# Patient Record
Sex: Male | Born: 1990 | Race: White | Hispanic: No | Marital: Single | Smoking: Current every day smoker
Health system: Southern US, Community
[De-identification: ages and names within clinical notes are randomized; demographics above are authoritative.]

## PROBLEM LIST (undated history)

## (undated) DIAGNOSIS — J45909 Unspecified asthma, uncomplicated: Secondary | ICD-10-CM

## (undated) HISTORY — PX: TONSILLECTOMY: SUR1361

---
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
## 2010-07-22 ENCOUNTER — Emergency Department: Payer: Self-pay | Admitting: Emergency Medicine

## 2011-01-18 ENCOUNTER — Emergency Department: Payer: Self-pay | Admitting: Emergency Medicine

## 2011-07-01 ENCOUNTER — Emergency Department: Payer: Self-pay | Admitting: Unknown Physician Specialty

## 2012-10-23 ENCOUNTER — Emergency Department: Payer: Self-pay | Admitting: Emergency Medicine

## 2013-07-24 ENCOUNTER — Emergency Department: Payer: Self-pay | Admitting: Emergency Medicine

## 2013-10-01 ENCOUNTER — Emergency Department: Payer: Self-pay | Admitting: Emergency Medicine

## 2013-12-09 ENCOUNTER — Emergency Department: Payer: Self-pay | Admitting: Emergency Medicine

## 2013-12-11 LAB — GC/CHLAMYDIA PROBE AMP

## 2015-01-02 ENCOUNTER — Ambulatory Visit
Admission: EM | Admit: 2015-01-02 | Discharge: 2015-01-02 | Disposition: A | Discharge status: Home / Self Care | Payer: Self-pay | Attending: Family Medicine | Admitting: Family Medicine

## 2015-01-02 DIAGNOSIS — S39012A Strain of muscle, fascia and tendon of lower back, initial encounter: Secondary | ICD-10-CM

## 2015-01-02 DIAGNOSIS — K029 Dental caries, unspecified: Secondary | ICD-10-CM

## 2015-01-02 HISTORY — DX: Unspecified asthma, uncomplicated: J45.909

## 2015-01-02 MED ORDER — METAXALONE 800 MG PO TABS: 800.0000 mg | ORAL_TABLET | Freq: Three times a day (TID) | ORAL | Status: AC

## 2015-01-02 MED ORDER — NAPROXEN 500 MG PO TABS: 500.0000 mg | ORAL_TABLET | Freq: Two times a day (BID) | ORAL | Status: AC

## 2015-01-02 MED ORDER — KETOROLAC TROMETHAMINE 60 MG/2ML IM SOLN
60.0000 mg | Freq: Once | INTRAMUSCULAR | Status: AC
  Administered 2015-01-02: 60 mg via INTRAMUSCULAR

## 2015-01-02 NOTE — ED Notes (Signed)
States when woke up this morning,states low back was very painful and couldn't work today. Also states top right molar is infected and has appointment next month to have tooth pulled. Stated tooth very painful

## 2015-01-02 NOTE — Discharge Instructions (Signed)
Back Injury Prevention °Back injuries can be very painful. They can also be difficult to heal. After having one back injury, you are more likely to injure your back again. It is important to learn how to avoid injuring or re-injuring your back. The following tips can help you to prevent a back injury. °WHAT SHOULD I KNOW ABOUT PHYSICAL FITNESS? °· Exercise for 30 minutes per day on most days of the week or as directed by your health care provider. Make sure to: °· Do aerobic exercises, such as walking, jogging, biking, or swimming. °· Do exercises that increase balance and strength, such as tai chi and yoga. These can decrease your risk of falling and injuring your back. °· Do stretching exercises to help with flexibility. °· Try to develop strong abdominal muscles. Your abdominal muscles provide a lot of the support that is needed by your back. °· Maintain a healthy weight.  This helps to decrease your risk of a back injury. °WHAT SHOULD I KNOW ABOUT MY DIET? °· Talk with your health care provider about your overall diet. Take supplements and vitamins only as directed by your health care provider. °· Talk with your health care provider about how much calcium and vitamin D you need each day. These nutrients help to prevent weakening of the bones (osteoporosis). Osteoporosis can cause broken (fractured) bones, which lead to back pain. °· Include good sources of calcium in your diet, such as dairy products, green leafy vegetables, and products that have had calcium added to them (fortified). °· Include good sources of vitamin D in your diet, such as milk and foods that are fortified with vitamin D. °WHAT SHOULD I KNOW ABOUT MY POSTURE? °· Sit up straight and stand up straight. Avoid leaning forward when you sit or hunching over when you stand. °· Choose chairs that have good low-back (lumbar) support. °· If you work at a desk, sit close to it so you do not need to lean over. Keep your chin tucked in. Keep your neck  drawn back, and keep your elbows bent at a right angle. Your arms should look like the letter "L." °· Sit high and close to the steering wheel when you drive. Add a lumbar support to your car seat, if needed. °· Avoid sitting or standing in one position for very long. Take breaks to get up, stretch, and walk around at least one time every hour. Take breaks every hour if you are driving for long periods of time. °· Sleep on your side with your knees slightly bent, or sleep on your back with a pillow under your knees. Do not lie on the front of your body to sleep. °WHAT SHOULD I KNOW ABOUT LIFTING, TWISTING, AND REACHING? °Lifting and Heavy Lifting °· Avoid heavy lifting, especially repetitive heavy lifting. If you must do heavy lifting: °· Stretch before lifting. °· Work slowly. °· Rest between lifts. °· Use a tool such as a cart or a dolly to move objects if one is available. °· Make several small trips instead of carrying one heavy load. °· Ask for help when you need it, especially when moving big objects. °· Follow these steps when lifting: °· Stand with your feet shoulder-width apart. °· Get as close to the object as you can. Do not try to pick up a heavy object that is far from your body. °· Use handles or lifting straps if they are available. °· Bend at your knees. Squat down, but keep your heels off the floor. °·   Keep your shoulders pulled back, your chin tucked in, and your back straight.  Lift the object slowly while you tighten the muscles in your legs, abdomen, and buttocks. Keep the object as close to the center of your body as possible.  Follow these steps when putting down a heavy load:  Stand with your feet shoulder-width apart.  Lower the object slowly while you tighten the muscles in your legs, abdomen, and buttocks. Keep the object as close to the center of your body as possible.  Keep your shoulders pulled back, your chin tucked in, and your back straight.  Bend at your knees. Squat  down, but keep your heels off the floor.  Use handles or lifting straps if they are available. Twisting and Reaching  Avoid lifting heavy objects above your waist.  Do not twist at your waist while you are lifting or carrying a load. If you need to turn, move your feet.  Do not bend over without bending at your knees.  Avoid reaching over your head, across a table, or for an object on a high surface. WHAT ARE SOME OTHER TIPS?  Avoid wet floors and icy ground. Keep sidewalks clear of ice to prevent falls.  Do not sleep on a mattress that is too soft or too hard.  Keep items that are used frequently within easy reach.  Put heavier objects on shelves at waist level, and put lighter objects on lower or higher shelves.  Find ways to decrease your stress, such as exercise, massage, or relaxation techniques. Stress can build up in your muscles. Tense muscles are more vulnerable to injury.  Talk with your health care provider if you feel anxious or depressed. These conditions can make back pain worse.  Wear flat heel shoes with cushioned soles.  Avoid sudden movements.  Use both shoulder straps when carrying a backpack.  Do not use any tobacco products, including cigarettes, chewing tobacco, or electronic cigarettes. If you need help quitting, ask your health care provider.   This information is not intended to replace advice given to you by your health care provider. Make sure you discuss any questions you have with your health care provider.   Document Released: 02/14/2004 Document Revised: 05/23/2014 Document Reviewed: 01/10/2014 Elsevier Interactive Patient Education 2016 [place] [employer] Dental caries (also called tooth decay) is the most common oral disease. It can occur at any age but is more common in children and young adults.  HOW DENTAL CARIES DEVELOPS  The process of decay begins when bacteria and foods (particularly sugars and starches) combine in your  mouth to produce plaque. Plaque is a substance that sticks to the hard, outer surface of a tooth (enamel). The bacteria in plaque produce acids that attack enamel. These acids may also attack the root surface of a tooth (cementum) if it is exposed. Repeated attacks dissolve these surfaces and create holes in the tooth (cavities). If left untreated, the acids destroy the other layers of the tooth.  RISK FACTORS  Frequent sipping of sugary beverages.   Frequent snacking on sugary and starchy foods, especially those that easily get stuck in the teeth.   Poor oral hygiene.   Dry mouth.   Substance abuse such as methamphetamine abuse.   Broken or poor-fitting dental restorations.   Eating disorders.   Gastroesophageal reflux disease (GERD).   Certain radiation treatments to the head and neck. SYMPTOMS In the early stages of dental caries, symptoms are seldom present. Sometimes white, chalky areas  may be seen on the enamel or other tooth layers. In later stages, symptoms may include:  Pits and holes on the enamel.  Toothache after sweet, hot, or cold foods or drinks are consumed.  Pain around the tooth.  Swelling around the tooth. DIAGNOSIS  Most of the time, dental caries is detected during a regular dental checkup. A diagnosis is made after a thorough medical and dental history is taken and the surfaces of your teeth are checked for signs of dental caries. Sometimes special instruments, such as lasers, are used to check for dental caries. Dental X-ray exams may be taken so that areas not visible to the eye (such as between the contact areas of the teeth) can be checked for cavities.  TREATMENT  If dental caries is in its early stages, it may be reversed with a fluoride treatment or an application of a remineralizing agent at the dental office. Thorough brushing and flossing at home is needed to aid these treatments. If it is in its later stages, treatment depends on the location  and extent of tooth destruction:   If a small area of the tooth has been destroyed, the destroyed area will be removed and cavities will be filled with a material such as gold, silver amalgam, or composite resin.   If a large area of the tooth has been destroyed, the destroyed area will be removed and a cap (crown) will be fitted over the remaining tooth structure.   If the center part of the tooth (pulp) is affected, a procedure called a root canal will be needed before a filling or crown can be placed.   If most of the tooth has been destroyed, the tooth may need to be pulled (extracted). HOME CARE INSTRUCTIONS You can prevent, stop, or reverse dental caries at home by practicing good oral hygiene. Good oral hygiene includes:  Thoroughly cleaning your teeth at least twice a day with a toothbrush and dental floss.   Using a fluoride toothpaste. A fluoride mouth rinse may also be used if recommended by your dentist or health care provider.   Restricting the amount of sugary and starchy foods and sugary liquids you consume.   Avoiding frequent snacking on these foods and sipping of these liquids.   Keeping regular visits with a dentist for checkups and cleanings. PREVENTION   Practice good oral hygiene.  Consider a dental sealant. A dental sealant is a coating material that is applied by your dentist to the pits and grooves of teeth. The sealant prevents food from being trapped in them. It may protect the teeth for several years.  Ask about fluoride supplements if you live in a community without fluorinated water or with water that has a low fluoride content. Use fluoride supplements as directed by your dentist or health care provider.  Allow fluoride varnish applications to teeth if directed by your dentist or health care provider.   This information is not intended to replace advice given to you by your health care provider. Make sure you discuss any questions you have with  your health care provider.   Document Released: 09/28/2001 Document Revised: 01/27/2014 Document Reviewed: 01/09/2012 Elsevier Interactive Patient Education 2016 [employer]  Lumbosacral Strain Lumbosacral strain is a strain of any of the parts that make up your lumbosacral vertebrae. Your lumbosacral vertebrae are the bones that make up the lower third of your backbone. Your lumbosacral vertebrae are held together by muscles and tough, fibrous tissue (ligaments).  CAUSES  A  sudden blow to your back can cause lumbosacral strain. Also, anything that causes an excessive stretch of the muscles in the low back can cause this strain. This is typically seen when people exert themselves strenuously, fall, lift heavy objects, bend, or crouch repeatedly. RISK FACTORS  Physically demanding work.  Participation in pushing or pulling sports or sports that require a sudden twist of the back (tennis, golf, baseball).  Weight lifting.  Excessive lower back curvature.  Forward-tilted pelvis.  Weak back or abdominal muscles or both.  Tight hamstrings. SIGNS AND SYMPTOMS  Lumbosacral strain may cause pain in the area of your injury or pain that moves (radiates) down your leg.  DIAGNOSIS Your health care provider can often diagnose lumbosacral strain through a physical exam. In some cases, you may need tests such as X-ray exams.  TREATMENT  Treatment for your lower back injury depends on many factors that your clinician will have to evaluate. However, most treatment will include the use of anti-inflammatory medicines. HOME CARE INSTRUCTIONS   Avoid hard physical activities (tennis, racquetball, waterskiing) if you are not in proper physical condition for it. This may aggravate or create problems.  If you have a back problem, avoid sports requiring sudden body movements. Swimming and walking are generally safer activities.  Maintain good posture.  Maintain a healthy weight.  For acute  conditions, you may put ice on the injured area.  Put ice in a plastic bag.  Place a towel between your skin and the bag.  Leave the ice on for 20 minutes, 2-3 times a day.  When the low back starts healing, stretching and strengthening exercises may be recommended. SEEK MEDICAL CARE IF:  Your back pain is getting worse.  You experience severe back pain not relieved with medicines. SEEK IMMEDIATE MEDICAL CARE IF:   You have numbness, tingling, weakness, or problems with the use of your arms or legs.  There is a change in bowel or bladder control.  You have increasing pain in any area of the body, including your belly (abdomen).  You notice shortness of breath, dizziness, or feel faint.  You feel sick to your stomach (nauseous), are throwing up (vomiting), or become sweaty.  You notice discoloration of your toes or legs, or your feet get very cold. MAKE SURE YOU:   Understand these instructions.  Will watch your condition.  Will get help right away if you are not doing well or get worse.   This information is not intended to replace advice given to you by your health care provider. Make sure you discuss any questions you have with your health care provider.   Document Released: 10/16/2004 Document Revised: 01/27/2014 Document Reviewed: 08/25/2012 Elsevier Interactive Patient Education [employer].

## 2015-01-02 NOTE — ED Provider Notes (Signed)
CSN: [id number]     Arrival date & time 01/02/15  1654 History   First MD Initiated Contact with Patient 01/02/15 1804     Chief Complaint  Patient presents with  . Back Pain  . Dental Pain   (Consider location/radiation/quality/duration/timing/severity/associated sxs/prior Treatment) HPI  24 year old male who presents with 2 separate problems.  He states he woke up this morning with low back pain and was very painful from work today. 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 They weigh between 125 and 150 pounds each. He does remember a specific injury but this morning he certainly had back pain indicates the upper lumbar level with radiation up his spine and even him feeling numbness and tingling into his arms. Walking also is very painful for him especially when he walks on his left leg. No incontinence. The spasms feels on the left side.  His second problem is that of headache that has been chronic. Is a large cavity which has gone into the pulp. Especially painful when he has ever passed over it. Lastly is very painful. He does have an appointment with the [facility] dental school on 02/01/2015. He's been taking Tylenol but that has not been effective in helping with the pain. He said no fever or chills.  Past Medical History  Diagnosis Date  . Asthma    Past Surgical History  Procedure Laterality Date  . Tonsillectomy     Family History  Problem Relation Age of Onset  . Diabetes Mother    Social History  Substance Use Topics  . Smoking status: Current Every Day Smoker -- 0.50 packs/day    Types: Cigarettes  . Smokeless tobacco: None  . Alcohol Use: No    Review of Systems  Constitutional: Positive for activity change. Negative for fever, chills, appetite change and fatigue.  HENT: Positive for dental problem.   Musculoskeletal: Positive for myalgias and back pain.  All  other systems reviewed and are negative.   Allergies  Penicillins  Home Medications   Prior to Admission medications   Medication Sig Start Date End Date Taking? Authorizing Provider  acetaminophen (TYLENOL) 500 MG tablet Take 500 mg by mouth every 6 (six) hours as needed.   Yes Historical Provider, MD  metaxalone (SKELAXIN) 800 MG tablet Take 1 tablet (800 mg total) by mouth 3 (three) times daily. 01/02/15   Lutricia Feil, PA-C  naproxen (NAPROSYN) 500 MG tablet Take 1 tablet (500 mg total) by mouth 2 (two) times daily with a meal. 01/02/15   Lutricia Feil, PA-C   Meds Ordered and Administered this Visit   Medications  ketorolac (TORADOL) injection 60 mg (60 mg Intramuscular Given 01/02/15 1837)    BP 103/66 mmHg  Pulse 77  Temp(Src) 97.5 F (36.4 C) (Tympanic)  Resp 16  Ht  (1.753 m)  Wt 140 lb (63.504 kg)  BMI 20.67 kg/m2  SpO2 100% No data found.   Physical Exam  Constitutional: He is oriented to person, place, and time. He appears well-developed and well-nourished. No distress.  HENT:  Head: Normocephalic and atraumatic.  Examination of his teeth shows a large dental caries extending into the pulp on the first molar upper right. There is no gum swelling present. Patient has no facial swelling. No evidence of an abscess is present today. He is afebrile  Eyes: Conjunctivae are normal. Pupils are equal, round, and  reactive to light.  Neck: Normal range of motion. Neck supple.  Musculoskeletal: He exhibits tenderness.  Examination of the lumbar spine obvious spasm of the upper segments of. Range of motion is good with for flexion is slightly limited with lateral flexion bilaterally with complaints of pain area of spasm. Rotation is perhaps the most uncomfortable to the left and right. Is able to toe and heel walk adequately. Deep tendon reflexes are bilaterally symmetrical. Degrees testing is negative in the sitting position bilaterally at 90.  Neurological: He  is alert and oriented to person, place, and time. He displays normal reflexes. He exhibits normal muscle tone.  Skin: Skin is warm and dry. He is not diaphoretic.  Psychiatric: He has a normal mood and affect. His behavior is normal. Judgment and thought content normal.  Nursing note and vitals reviewed.   ED Course  Procedures (including critical care time)  Labs Review Labs Reviewed - No data to display  Imaging Review No results found.   Visual Acuity Review  Right Eye Distance:   Left Eye Distance:   Bilateral Distance:    Right Eye Near:   Left Eye Near:    Bilateral Near:      18:37 Medication Given JE  ketorolac (TORADOL) injection 60 mg - Dose: 60 mg ; Route: Intramuscular ; Site: Other ; Scheduled Time: 1830 ; Comment: Exp: 03/18 Lot: [id number] Left glut        MDM   1. Dental caries extending into pulp   2. Lumbar strain, initial encounter    Discharge Medication List as of 01/02/2015  6:33 PM    START taking these medications   Details  metaxalone (SKELAXIN) 800 MG tablet Take 1 tablet (800 mg total) by mouth 3 (three) times daily., Starting 01/02/2015, Until Discontinued, Print    naproxen (NAPROSYN) 500 MG tablet Take 1 tablet (500 mg total) by mouth 2 (two) times daily with a meal., Starting 01/02/2015, Until Discontinued, Print      Plan: 1. Diagnosis reviewed with patient 2. rx as per orders; risks, benefits, potential side effects reviewed with patient 3. Recommend supportive treatment with rest and symptom avoidance. He needs to call the dental school to see if he get a sooner if at all possible. In meantime I have given him prescription for Naprosyn that he should take on a daily basis until he seen by the dentist. So to help with his back spasm. He states that he must work up because of a point system that they have did give him a note stating he was here today. Follow-up with a primary care physician if he is having any additional problems 4. F/u  prn if symptoms worsen or don't improve     Lutricia FeilWilliam P Roemer, PA-C 01/02/15 1858

## 2015-10-30 ENCOUNTER — Encounter: Payer: Self-pay | Admitting: *Deleted

## 2015-10-30 ENCOUNTER — Ambulatory Visit
Admission: EM | Admit: 2015-10-30 | Discharge: 2015-10-30 | Disposition: A | Discharge status: Home / Self Care | Payer: Self-pay | Attending: Family Medicine | Admitting: Family Medicine

## 2015-10-30 DIAGNOSIS — K529 Noninfective gastroenteritis and colitis, unspecified: Secondary | ICD-10-CM

## 2015-10-30 MED ORDER — BISMUTH SUBSALICYLATE 262 MG/15ML PO SUSP: 30.0000 mL | Freq: Three times a day (TID) | ORAL | 0 refills | Status: AC

## 2015-10-30 MED ORDER — RANITIDINE HCL 150 MG PO CAPS: ORAL_CAPSULE | ORAL | 0 refills | Status: AC

## 2015-10-30 MED ORDER — ONDANSETRON 8 MG PO TBDP
8.0000 mg | ORAL_TABLET | Freq: Once | ORAL | Status: AC
  Administered 2015-10-30: 8 mg via ORAL

## 2015-10-30 MED ORDER — ONDANSETRON 8 MG PO TBDP: 8.0000 mg | ORAL_TABLET | Freq: Three times a day (TID) | ORAL | 0 refills | Status: AC | PRN

## 2015-10-30 NOTE — ED Triage Notes (Signed)
Pt awoke with N/V/D this morning. Mother awoke with same. Denies fever.

## 2015-10-30 NOTE — ED Provider Notes (Signed)
[facility]    CSN: [id number] Arrival date & time: 10/30/15  1701     History   Chief Complaint Chief Complaint  Patient presents with  . Nausea  . Emesis  . Diarrhea    HPI Geoffrey Smith is a 25 y.o. male.   Patient's here because of nausea vomiting diarrhea they'll started this morning. He states his mother has been came to visit him and she's contacted him and them know that she's had a stomach virus. He states actually things are better now he is not having the nausea and vomiting needed for the diarrhea seems to be getting better as well or get him today. He's not eaten much either. States of abdominal pain earlier today is actually improved as well. Her surgery is tonsillectomy. Medical problems she's had a history of asthma. Mother has diabetes he is a current smoker. He is allergic to penicillin.    Emesis  Associated symptoms: diarrhea   Diarrhea  Associated symptoms: vomiting     Past Medical History:  Diagnosis Date  . Asthma     There are no active problems to display for this patient.   Past Surgical History:  Procedure Laterality Date  . TONSILLECTOMY         Home Medications    Prior to Admission medications   Medication Sig Start Date End Date Taking? Authorizing Provider  acetaminophen (TYLENOL) 500 MG tablet Take 500 mg by mouth every 6 (six) hours as needed.    Historical Provider, MD  bismuth subsalicylate (PEPTO-BISMOL) 262 MG/15ML suspension Take 30 mLs by mouth 3 (three) times daily. With zantac three times a day for the next 3-5 days 10/30/15   Hassan Rowan, MD  metaxalone (SKELAXIN) 800 MG tablet Take 1 tablet (800 mg total) by mouth 3 (three) times daily. 01/02/15   Lutricia Feil, PA-C  naproxen (NAPROSYN) 500 MG tablet Take 1 tablet (500 mg total) by mouth 2 (two) times daily with a meal. 01/02/15   Lutricia Feil, PA-C  ondansetron (ZOFRAN ODT) 8 MG disintegrating tablet Take 1 tablet (8 mg total) by mouth every 8  (eight) hours as needed for nausea or vomiting. 10/30/15   Hassan Rowan, MD  ranitidine (ZANTAC) 150 MG capsule 1 tablet three x a day for the next 3-5 days with pepto bismol 10/30/15   Hassan Rowan, MD    Family History Family History  Problem Relation Age of Onset  . Diabetes Mother     Social History Social History  Substance Use Topics  . Smoking status: Current Every Day Smoker    Packs/day: 0.50    Types: Cigarettes  . Smokeless tobacco: Never Used  . Alcohol use No     Allergies   Penicillins   Review of Systems Review of Systems  Gastrointestinal: Positive for diarrhea and vomiting.  All other systems reviewed and are negative.    Physical Exam Triage Vital Signs ED Triage Vitals  Enc Vitals Group     BP 10/30/15 1712 (!) 108/50     Pulse Rate 10/30/15 1712 71     Resp 10/30/15 1712 16     Temp 10/30/15 1712 98 F (36.7 C)     Temp Source 10/30/15 1712 Oral     SpO2 10/30/15 1712 100 %     Weight 10/30/15 1714 135 lb (61.2 kg)     Height 10/30/15 1714 5\' 8"  (1.727 m)     Head Circumference --  Peak Flow --      Pain Score --      Pain Loc --      Pain Edu? --      Excl. in GC? --    No data found.   Updated Vital Signs BP (!) 108/50 (BP Location: Left Arm)   Pulse 71   Temp 98 F (36.7 C) (Oral)   Resp 16   Ht 5\' 8"  (1.727 m)   Wt 135 lb (61.2 kg)   SpO2 100%   BMI 20.53 kg/m   Visual Acuity Right Eye Distance:   Left Eye Distance:   Bilateral Distance:    Right Eye Near:   Left Eye Near:    Bilateral Near:     Physical Exam  Constitutional: He is oriented to person, place, and time. He appears well-developed and well-nourished.  HENT:  Head: Normocephalic and atraumatic.  Eyes: Pupils are equal, round, and reactive to light.  Neck: Normal range of motion. Neck supple. No tracheal deviation present. No thyromegaly present.  Cardiovascular: Normal rate and regular rhythm.   Pulmonary/Chest: Effort normal and breath sounds  normal.  Abdominal: Soft. Bowel sounds are normal. There is no hepatosplenomegaly. There is tenderness. There is no rebound, no CVA tenderness, no tenderness at McBurney's point and negative Murphy's sign. No hernia.  At this time he has very mild tenderness diffusely over the abdomen  Musculoskeletal: Normal range of motion.  Lymphadenopathy:    He has no cervical adenopathy.  Neurological: He is alert and oriented to person, place, and time.  Skin: Skin is warm and dry.  Psychiatric: He has a normal mood and affect.  Vitals reviewed.    UC Treatments / Results  Labs (all labs ordered are listed, but only abnormal results are displayed) Labs Reviewed - No data to display  EKG  EKG Interpretation None       Radiology No results found.  Procedures Procedures (including critical care time)  Medications Ordered in UC Medications  ondansetron (ZOFRAN-ODT) disintegrating tablet 8 mg (not administered)     Initial Impression / Assessment and Plan / UC Course  I have reviewed the triage vital signs and the nursing notes.  Pertinent labs & imaging results that were available during my care of the patient were reviewed by me and considered in my medical decision making (see chart for details).  Clinical Course    Patient is doing better now. Will give a dose of Zofran before he leaves no placement of Pepto-Bismol Zantac and Zofran to use of the next few days work note given for today and tomorrow. Initially did not want to multiple explained to him that is better to have it and not use it did not have it. Follow-up PCP if not better next for 5 days. Warned that the diarrhea may return to use Imodium.   Final Clinical Impressions(s) / UC Diagnoses   Final diagnoses:  Gastroenteritis    New Prescriptions New Prescriptions   BISMUTH SUBSALICYLATE (PEPTO-BISMOL) 262 MG/15ML SUSPENSION    Take 30 mLs by mouth 3 (three) times daily. With zantac three times a day for the next  3-5 days   ONDANSETRON (ZOFRAN ODT) 8 MG DISINTEGRATING TABLET    Take 1 tablet (8 mg total) by mouth every 8 (eight) hours as needed for nausea or vomiting.   RANITIDINE (ZANTAC) 150 MG CAPSULE    1 tablet three x a day for the next 3-5 days with pepto bismol  Hassan Rowan, MD 10/30/15 [phone]

## 2015-11-01 ENCOUNTER — Telehealth: Payer: Self-pay | Admitting: *Deleted

## 2015-11-01 NOTE — Telephone Encounter (Signed)
Courtesy follow up call, no answer. Left message to call back if symptoms persist or any questions arise.

## 2023-09-09 ENCOUNTER — Ambulatory Visit
Admission: EM | Admit: 2023-09-09 | Discharge: 2023-09-09 | Disposition: A | Discharge status: Home / Self Care | Attending: Physician Assistant | Admitting: Physician Assistant

## 2023-09-09 ENCOUNTER — Ambulatory Visit (INDEPENDENT_AMBULATORY_CARE_PROVIDER_SITE_OTHER)

## 2023-09-09 DIAGNOSIS — R0602 Shortness of breath: Secondary | ICD-10-CM

## 2023-09-09 DIAGNOSIS — R051 Acute cough: Secondary | ICD-10-CM

## 2023-09-09 DIAGNOSIS — B349 Viral infection, unspecified: Secondary | ICD-10-CM

## 2023-09-09 DIAGNOSIS — J45901 Unspecified asthma with (acute) exacerbation: Secondary | ICD-10-CM

## 2023-09-09 MED ORDER — PROMETHAZINE-DM 6.25-15 MG/5ML PO SYRP: 5.0000 mL | ORAL_SOLUTION | Freq: Four times a day (QID) | ORAL | 0 refills | Status: AC | PRN

## 2023-09-09 MED ORDER — ALBUTEROL SULFATE HFA 108 (90 BASE) MCG/ACT IN AERS: 1.0000 | INHALATION_SPRAY | Freq: Four times a day (QID) | RESPIRATORY_TRACT | 0 refills | Status: AC | PRN

## 2023-09-09 MED ORDER — PREDNISONE 20 MG PO TABS: 40.0000 mg | ORAL_TABLET | Freq: Every day | ORAL | 0 refills | Status: AC

## 2023-09-09 NOTE — Discharge Instructions (Addendum)
-   Your x-ray did not show pneumonia or fluid in your lungs. -As discussed, you likely have a chest cold and flareup of underlying asthma. - I sent prednisone , inhaler and cough medicine to the pharmacy. - Cough and congestion related to viral chest colds can last a few weeks. - You should return if you have fever, worsening cough or increased shortness of breath.

## 2023-09-09 NOTE — ED Provider Notes (Signed)
 [facility]    CSN: [id number] Arrival date & time: 09/09/23  1013      History   Chief Complaint Chief Complaint  Patient presents with   Shortness of Breath   Wheezing   Cough    HPI Geoffrey Smith is a 33 y.o. male with history of asthma.  He presents today for a 6-day history of fatigue, cough, congestion, scratchy throat, wheezing and shortness of breath.  Reports a fever the first 2 days.  Patient states cough is nonproductive.  He has been taking over-the-counter medication for symptoms which has helped a little bit.  His employer sent him to be evaluated here due to concern for bronchitis.  Of note, patient reports he recently quit smoking.  HPI  Past Medical History:  Diagnosis Date   Asthma     There are no active problems to display for this patient.   Past Surgical History:  Procedure Laterality Date   TONSILLECTOMY         Home Medications    Prior to Admission medications   Medication Sig Start Date End Date Taking? Authorizing Provider  albuterol  (VENTOLIN  HFA) 108 (90 Base) MCG/ACT inhaler Inhale 1-2 puffs into the lungs every 6 (six) hours as needed for wheezing or shortness of breath. 09/09/23  Yes Arvis Huxley B, PA-C  predniSONE  (DELTASONE ) 20 MG tablet Take 2 tablets (40 mg total) by mouth daily for 5 days. 09/09/23 09/14/23 Yes Arvis Huxley B, PA-C  promethazine -dextromethorphan (PROMETHAZINE -DM) 6.25-15 MG/5ML syrup Take 5 mLs by mouth 4 (four) times daily as needed. 09/09/23  Yes Arvis Huxley NOVAK, PA-C  acetaminophen (TYLENOL) 500 MG tablet Take 500 mg by mouth every 6 (six) hours as needed.    [provider]  bismuth  subsalicylate (PEPTO-BISMOL) 262 MG/15ML suspension Take 30 mLs by mouth 3 (three) times daily. With zantac  three times a day for the next 3-5 days 10/30/15   Desiderio Beagle, MD  metaxalone  (SKELAXIN ) 800 MG tablet Take 1 tablet (800 mg total) by mouth 3 (three) times daily. 01/02/15   Lacinda Elsie SQUIBB, PA-C   naproxen  (NAPROSYN ) 500 MG tablet Take 1 tablet (500 mg total) by mouth 2 (two) times daily with a meal. 01/02/15   Lacinda Elsie SQUIBB, PA-C  ondansetron  (ZOFRAN  ODT) 8 MG disintegrating tablet Take 1 tablet (8 mg total) by mouth every 8 (eight) hours as needed for nausea or vomiting. 10/30/15   Desiderio Beagle, MD  ranitidine  (ZANTAC ) 150 MG capsule 1 tablet three x a day for the next 3-5 days with pepto bismol 10/30/15   Desiderio Beagle, MD    Family History Family History  Problem Relation Age of Onset   Diabetes Mother     Social History Social History   Tobacco Use   Smoking status: Every Day    Current packs/day: 0.50    Types: Cigarettes   Smokeless tobacco: Never  Substance Use Topics   Alcohol use: No   Drug use: Yes    Frequency: 3.0 times per week     Allergies   Covid-19 (mrna) vaccine, Penicillins, Venlafaxine, and Yellow jacket venom   Review of Systems Review of Systems  Constitutional:  Positive for fatigue. Negative for fever.  HENT:  Positive for congestion and rhinorrhea. Negative for sinus pressure, sinus pain and sore throat.   Respiratory:  Positive for cough, shortness of breath and wheezing.   Cardiovascular:  Negative for chest pain.  Gastrointestinal:  Negative for abdominal pain, diarrhea, nausea and vomiting.  Musculoskeletal:  Negative for myalgias.  Neurological:  Negative for weakness, light-headedness and headaches.  Hematological:  Negative for adenopathy.     Physical Exam Triage Vital Signs ED Triage Vitals  Encounter Vitals Group     BP 09/09/23 1022 117/77     Girls Systolic BP Percentile --      Girls Diastolic BP Percentile --      Boys Systolic BP Percentile --      Boys Diastolic BP Percentile --      Pulse Rate 09/09/23 1022 74     Resp 09/09/23 1022 20     Temp 09/09/23 1022 98.1 F (36.7 C)     Temp Source 09/09/23 1022 Oral     SpO2 09/09/23 1022 100 %     Weight 09/09/23 1021 138 lb (62.6 kg)     Height --      Head  Circumference --      Peak Flow --      Pain Score 09/09/23 1021 4     Pain Loc --      Pain Education --      Exclude from Growth Chart --    No data found.  Updated Vital Signs BP 117/77 (BP Location: Left Arm)   Pulse 74   Temp 98.1 F (36.7 C) (Oral)   Resp 20   Wt 138 lb (62.6 kg)   SpO2 100%   BMI 20.98 kg/m     Physical Exam Vitals and nursing note reviewed.  Constitutional:      General: He is not in acute distress.    Appearance: Normal appearance. He is well-developed. He is not ill-appearing.  HENT:     Head: Normocephalic and atraumatic.     Nose: Congestion present.     Mouth/Throat:     Mouth: Mucous membranes are moist.     Pharynx: Oropharynx is clear.  Eyes:     General: No scleral icterus.    Conjunctiva/sclera: Conjunctivae normal.  Cardiovascular:     Rate and Rhythm: Normal rate and regular rhythm.  Pulmonary:     Effort: Pulmonary effort is normal. No respiratory distress.     Breath sounds: Wheezing (scattered throughout left lung) present.  Musculoskeletal:     Cervical back: Neck supple.  Skin:    General: Skin is warm and dry.     Capillary Refill: Capillary refill takes less than 2 seconds.  Neurological:     General: No focal deficit present.     Mental Status: He is alert. Mental status is at baseline.     Motor: No weakness.     Gait: Gait normal.  Psychiatric:        Mood and Affect: Mood normal.        Behavior: Behavior normal.      UC Treatments / Results  Labs (all labs ordered are listed, but only abnormal results are displayed) Labs Reviewed - No data to display  EKG   Radiology DG Chest 2 View Result Date: 09/09/2023 CLINICAL DATA:  Cough, wheezing, shortness of breath. EXAM: CHEST - 2 VIEW COMPARISON:  None Available. FINDINGS: The heart size and mediastinal contours are within normal limits. Both lungs are clear. The visualized skeletal structures are unremarkable. IMPRESSION: No active cardiopulmonary  disease. Electronically Signed   By: Lynwood Landy Raddle M.D.   On: 09/09/2023 11:08    Procedures Procedures (including critical care time)  Medications Ordered in UC Medications - No data to display  Initial Impression /  Assessment and Plan / UC Course  I have reviewed the triage vital signs and the nursing notes.  Pertinent labs & imaging results that were available during my care of the patient were reviewed by me and considered in my medical decision making (see chart for details).   33 year old male presents for cough, congestion, scratchy throat, wheezing and shortness of breath for 6 days.  History of asthma.  Does not have an inhaler.  Recently quit smoking.  Vitals are stable and normal.  Overall well-appearing.  No acute distress but on exam has nasal congestion.  Throat is clear.  Scattered wheezes throughout the left lung fields.  Chest x-ray obtained.  Negative.  Acute bronchitis/viral illness and asthma exacerbation.  Treating at this time with prednisone , albuterol  and Promethazine  DM.  Encouraged increased rest and fluids.  Explained viral bronchitis can last for a few weeks.  Reviewed return precautions.  Work note given.   Final Clinical Impressions(s) / UC Diagnoses   Final diagnoses:  Shortness of breath  Viral illness  Acute cough  Exacerbation of asthma, unspecified asthma severity, unspecified whether persistent     Discharge Instructions      - Your x-ray did not show pneumonia or fluid in your lungs. -As discussed, you likely have a chest cold and flareup of underlying asthma. - I sent prednisone , inhaler and cough medicine to the pharmacy. - Cough and congestion related to viral chest colds can last a few weeks. - You should return if you have fever, worsening cough or increased shortness of breath.     ED Prescriptions     Medication Sig Dispense Auth. Provider   predniSONE  (DELTASONE ) 20 MG tablet Take 2 tablets (40 mg total) by mouth daily for  5 days. 10 tablet Arvis Jolan NOVAK, PA-C   albuterol  (VENTOLIN  HFA) 108 (90 Base) MCG/ACT inhaler Inhale 1-2 puffs into the lungs every 6 (six) hours as needed for wheezing or shortness of breath. 1 each Arvis Jolan NOVAK, PA-C   promethazine -dextromethorphan (PROMETHAZINE -DM) 6.25-15 MG/5ML syrup Take 5 mLs by mouth 4 (four) times daily as needed. 118 mL Arvis Jolan NOVAK, PA-C      PDMP not reviewed this encounter.   Arvis Jolan NOVAK, PA-C 09/09/23 1126

## 2023-09-09 NOTE — ED Triage Notes (Addendum)
 Patient states that he's had his sx 6 days, patient states that he quit smoking Wednesday of last week.   Wheezing SOB Cough, non productive  Fever that broke Sat.  Patient states that he's been taking muxcinex.
